# Patient Record
Sex: Male | Born: 1990 | Race: Black or African American | Hispanic: No | Marital: Single | Smoking: Current every day smoker
Health system: Southern US, Community
[De-identification: ages and names within clinical notes are randomized; demographics above are authoritative.]

## PROBLEM LIST (undated history)

## (undated) DIAGNOSIS — R51 Headache: Secondary | ICD-10-CM

---
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
## 2008-01-01 ENCOUNTER — Emergency Department (HOSPITAL_COMMUNITY): Admission: EM | Admit: 2008-01-01 | Discharge: 2008-01-01 | Payer: Self-pay | Admitting: Family Medicine

## 2008-07-29 ENCOUNTER — Emergency Department (HOSPITAL_COMMUNITY): Admission: EM | Admit: 2008-07-29 | Discharge: 2008-07-29 | Payer: Self-pay | Admitting: Emergency Medicine

## 2010-04-14 ENCOUNTER — Emergency Department (HOSPITAL_COMMUNITY): Admission: EM | Admit: 2010-04-14 | Discharge: 2010-04-14 | Payer: Self-pay | Admitting: Family Medicine

## 2011-02-08 LAB — GC/CHLAMYDIA PROBE AMP, GENITAL
Chlamydia, DNA Probe: NEGATIVE
GC Probe Amp, Genital: NEGATIVE

## 2011-08-13 LAB — WOUND CULTURE

## 2012-09-12 ENCOUNTER — Encounter (HOSPITAL_COMMUNITY): Payer: Self-pay | Admitting: *Deleted

## 2012-09-12 ENCOUNTER — Emergency Department (HOSPITAL_COMMUNITY)
Admission: EM | Admit: 2012-09-12 | Discharge: 2012-09-12 | Disposition: A | Discharge status: Home / Self Care | Payer: Self-pay | Attending: Emergency Medicine | Admitting: Emergency Medicine

## 2012-09-12 DIAGNOSIS — R112 Nausea with vomiting, unspecified: Secondary | ICD-10-CM | POA: Insufficient documentation

## 2012-09-12 DIAGNOSIS — R197 Diarrhea, unspecified: Secondary | ICD-10-CM | POA: Insufficient documentation

## 2012-09-12 DIAGNOSIS — F17200 Nicotine dependence, unspecified, uncomplicated: Secondary | ICD-10-CM | POA: Insufficient documentation

## 2012-09-12 DIAGNOSIS — R51 Headache: Secondary | ICD-10-CM | POA: Insufficient documentation

## 2012-09-12 HISTORY — DX: Headache: R51

## 2012-09-12 LAB — URINALYSIS, ROUTINE W REFLEX MICROSCOPIC
Glucose, UA: NEGATIVE mg/dL
Hgb urine dipstick: NEGATIVE
Ketones, ur: NEGATIVE mg/dL
Leukocytes, UA: NEGATIVE
Protein, ur: NEGATIVE mg/dL
Urobilinogen, UA: 1 mg/dL (ref 0.0–1.0)

## 2012-09-12 LAB — COMPREHENSIVE METABOLIC PANEL
Albumin: 3.8 g/dL (ref 3.5–5.2)
Alkaline Phosphatase: 78 U/L (ref 39–117)
BUN: 14 mg/dL (ref 6–23)
CO2: 28 mEq/L (ref 19–32)
Chloride: 102 mEq/L (ref 96–112)
Creatinine, Ser: 1.14 mg/dL (ref 0.50–1.35)
GFR calc non Af Amer: 89 mL/min — ABNORMAL LOW (ref >90)
Glucose, Bld: 96 mg/dL (ref 70–99)
Potassium: 3.8 mEq/L (ref 3.5–5.1)
Total Bilirubin: 0.1 mg/dL — ABNORMAL LOW (ref 0.3–1.2)

## 2012-09-12 LAB — CBC
HCT: 44.6 % (ref 39.0–52.0)
Hemoglobin: 15.1 g/dL (ref 13.0–17.0)
MCV: 90.8 fL (ref 78.0–100.0)
RBC: 4.91 MIL/uL (ref 4.22–5.81)
RDW: 13.9 % (ref 11.5–15.5)
WBC: 8.2 10*3/uL (ref 4.0–10.5)

## 2012-09-12 MED ORDER — FAMOTIDINE 20 MG PO TABS
40.0000 mg | ORAL_TABLET | Freq: Once | ORAL | Status: AC
  Administered 2012-09-12: 40 mg via ORAL
  Filled 2012-09-12: qty 2

## 2012-09-12 MED ORDER — ONDANSETRON 8 MG PO TBDP
8.0000 mg | ORAL_TABLET | Freq: Once | ORAL | Status: AC
  Administered 2012-09-12: 8 mg via ORAL
  Filled 2012-09-12: qty 1

## 2012-09-12 MED ORDER — ONDANSETRON HCL 4 MG PO TABS: 4.0000 mg | ORAL_TABLET | Freq: Three times a day (TID) | ORAL | Status: DC | PRN

## 2012-09-12 MED ORDER — GI COCKTAIL ~~LOC~~
30.0000 mL | Freq: Once | ORAL | Status: AC
  Administered 2012-09-12: 30 mL via ORAL
  Filled 2012-09-12: qty 30

## 2012-09-12 MED ORDER — ACETAMINOPHEN 500 MG PO TABS
1000.0000 mg | ORAL_TABLET | Freq: Once | ORAL | Status: AC
  Administered 2012-09-12: 1000 mg via ORAL
  Filled 2012-09-12: qty 2

## 2012-09-12 NOTE — ED Provider Notes (Signed)
History     CSN: [id number]  Arrival date & time 09/12/12  0903   First MD Initiated Contact with Patient 09/12/12 [phone]      Chief Complaint  Patient presents with  . Abdominal Pain     HPI Pt was seen at 0925.  Per pt, c/o gradual onset and persistence of constant upper abd "pain" for the past 2 days.  Has been associated with several intermittent episodes of N/V/D as well as an acute flair of his chronic headache. Describes the diarrhea as "loose stools." Denies CP/SOB, no cough, no back pain, no fevers, no rash, no black or blood in stools or emesis.  Describes the headache as per his usual chronic headache pain pattern.  Denies headache was sudden or maximal in onset or at any time.  Denies visual changes, no focal motor weakness, no tingling/numbness in extremities, no neck pain.     Past Medical History  Diagnosis Date  . Headache     History reviewed. No pertinent past surgical history.   History  Substance Use Topics  . Smoking status: Current Every Day Smoker    Types: Cigars  . Smokeless tobacco: Not on file  . Alcohol Use: No      Review of Systems ROS: Statement: All systems negative except as marked or noted in the HPI; Constitutional: Negative for fever and chills. ; ; Eyes: Negative for eye pain, redness and discharge. ; ; ENMT: Negative for ear pain, hoarseness, nasal congestion, sinus pressure and sore throat. ; ; Cardiovascular: Negative for chest pain, palpitations, diaphoresis, dyspnea and peripheral edema. ; ; Respiratory: Negative for cough, wheezing and stridor. ; ; Gastrointestinal: +abd pain, N/V, "loose stools." Negative for diarrhea, blood in stool, hematemesis, jaundice and rectal bleeding. . ; ; Genitourinary: Negative for dysuria, flank pain and hematuria. ; ; Musculoskeletal: Negative for back pain and neck pain. Negative for swelling and trauma.; ; Skin: Negative for pruritus, rash, abrasions, blisters, bruising and skin lesion.; ; Neuro: +chronic  headache. Negative for lightheadedness and neck stiffness. Negative for weakness, altered level of consciousness , altered mental status, extremity weakness, paresthesias, involuntary movement, seizure and syncope.       Allergies  Review of patient's allergies indicates no known allergies.  Home Medications   Current Outpatient Rx  Name Route Sig Dispense Refill  . ACETAMINOPHEN 500 MG PO TABS Oral Take 1,000 mg by mouth every 6 (six) hours as needed. Pain    . IBUPROFEN 200 MG PO TABS Oral Take 200 mg by mouth every 6 (six) hours as needed. Pain      BP 118/65  Pulse 57  Temp 98.6 F (37 C) (Oral)  Resp 20  Wt 187 lb (84.823 kg)  SpO2 98%  Physical Exam 0930: Physical examination:  Nursing notes reviewed; Vital signs and O2 SAT reviewed;  Constitutional: Well developed, Well nourished, Well hydrated, In no acute distress; Head:  Normocephalic, atraumatic; Eyes: EOMI, PERRL, No scleral icterus; ENMT: Mouth and pharynx normal, Mucous membranes moist; Neck: Supple, Full range of motion, No lymphadenopathy; Cardiovascular: Regular rate and rhythm, No murmur, rub, or gallop; Respiratory: Breath sounds clear & equal bilaterally, No rales, rhonchi, wheezes.  Speaking full sentences with ease, Normal respiratory effort/excursion; Chest: Nontender, Movement normal; Abdomen: Soft, Nontender, Nondistended, Normal bowel sounds; Genitourinary: No CVA tenderness; Extremities: Pulses normal, No tenderness, No edema, No calf edema or asymmetry.; Neuro: AA&Ox3, Major CN grossly intact.  Speech clear. Climbs on and off stretcher by himself easily and  without distress. Gait steady. No gross focal motor or sensory deficits in extremities.; Skin: Color normal, Warm, Dry.   ED Course  Procedures    MDM  MDM Reviewed: nursing note and vitals Interpretation: labs   Results for orders placed during the hospital encounter of 09/12/12  CBC      Component Value Range   WBC 8.2  4.0 - 10.5 K/uL   RBC  4.91  4.22 - 5.81 MIL/uL   Hemoglobin 15.1  13.0 - 17.0 g/dL   HCT 96.0  45.4 - 09.8 %   MCV 90.8  78.0 - 100.0 fL   MCH 30.8  26.0 - 34.0 pg   MCHC 33.9  30.0 - 36.0 g/dL   RDW 11.9  14.7 - 82.9 %   Platelets 280  150 - 400 K/uL  COMPREHENSIVE METABOLIC PANEL      Component Value Range   Sodium 139  135 - 145 mEq/L   Potassium 3.8  3.5 - 5.1 mEq/L   Chloride 102  96 - 112 mEq/L   CO2 28  19 - 32 mEq/L   Glucose, Bld 96  70 - 99 mg/dL   BUN 14  6 - 23 mg/dL   Creatinine, Ser 5.62  0.50 - 1.35 mg/dL   Calcium 9.5  8.4 - 13.0 mg/dL   Total Protein 7.2  6.0 - 8.3 g/dL   Albumin 3.8  3.5 - 5.2 g/dL   AST 10  0 - 37 U/L   ALT 15  0 - 53 U/L   Alkaline Phosphatase 78  39 - 117 U/L   Total Bilirubin 0.1 (*) 0.3 - 1.2 mg/dL   GFR calc non Af Amer 89 (*) >90 mL/min   GFR calc Af Amer >90  >90 mL/min  URINALYSIS, ROUTINE W REFLEX MICROSCOPIC      Component Value Range   Color, Urine YELLOW  YELLOW   APPearance CLOUDY (*) CLEAR   Specific Gravity, Urine 1.020  1.005 - 1.030   pH 7.5  5.0 - 8.0   Glucose, UA NEGATIVE  NEGATIVE mg/dL   Hgb urine dipstick NEGATIVE  NEGATIVE   Bilirubin Urine NEGATIVE  NEGATIVE   Ketones, ur NEGATIVE  NEGATIVE mg/dL   Protein, ur NEGATIVE  NEGATIVE mg/dL   Urobilinogen, UA 1.0  0.0 - 1.0 mg/dL   Nitrite NEGATIVE  NEGATIVE   Leukocytes, UA NEGATIVE  NEGATIVE  LIPASE, BLOOD      Component Value Range   Lipase 22  11 - 59 U/L     1055:  Pt has tol PO well without N/V.  No stooling while in the ED.  Wants to go home now.  Dx and testing d/w pt.  Questions answered.  Verb understanding, agreeable to d/c home with outpt f/u.            Laray Anger, DO 09/12/12 1956

## 2012-09-12 NOTE — ED Notes (Signed)
Pt alert and oriented x4. Respirations even and unlabored, bilateral symmetrical rise and fall of chest. Skin warm and dry. In no acute distress. Denies needs.   

## 2012-09-12 NOTE — ED Notes (Signed)
md alerted pt wants to go. Pt tolerated fluids and did not vomit

## 2012-09-12 NOTE — ED Notes (Signed)
Pt reports abd pain with n/v since Sunday.  Reports vomiting x 4 this am.  Denies any diarrhea at this time.  Pt also reports h/a.

## 2012-09-13 LAB — URINE CULTURE: Colony Count: NO GROWTH

## 2013-02-26 ENCOUNTER — Emergency Department (HOSPITAL_COMMUNITY)
Admission: EM | Admit: 2013-02-26 | Discharge: 2013-02-26 | Disposition: A | Discharge status: Home / Self Care | Payer: Self-pay | Source: Home / Self Care

## 2013-02-26 ENCOUNTER — Encounter (HOSPITAL_COMMUNITY): Payer: Self-pay | Admitting: *Deleted

## 2013-02-26 DIAGNOSIS — J209 Acute bronchitis, unspecified: Secondary | ICD-10-CM

## 2013-02-26 MED ORDER — GUAIFENESIN-CODEINE 100-10 MG/5ML PO SYRP: 5.0000 mL | ORAL_SOLUTION | Freq: Three times a day (TID) | ORAL | Status: AC | PRN

## 2013-02-26 NOTE — ED Provider Notes (Signed)
History     CSN: [id number]  Arrival date & time 02/26/13  1134   First MD Initiated Contact with Patient 02/26/13 1200      Chief Complaint  Patient presents with  . Cough    (Consider location/radiation/quality/duration/timing/severity/associated sxs/prior treatment) Patient is a 22 y.o. male presenting with cough.  Cough Associated symptoms: no shortness of breath and no wheezing    22 y/o presents with cough and a frontal headache. He vomited twice today and then ate without any further vomiting.   Past Medical History  Diagnosis Date  . Headache     History reviewed. No pertinent past surgical history.  History reviewed. No pertinent family history.  History  Substance Use Topics  . Smoking status: Current Every Day Smoker    Types: Cigars  . Smokeless tobacco: Not on file  . Alcohol Use: No      Review of Systems  Constitutional: Negative.   HENT: Negative.   Eyes: Negative.   Respiratory: Positive for cough. Negative for choking, chest tightness, shortness of breath, wheezing and stridor.   Cardiovascular: Negative.   Gastrointestinal: Positive for vomiting.  Endocrine: Negative.   Genitourinary: Negative.   Musculoskeletal: Negative.   Skin: Negative.   Neurological: Negative.   Hematological: Negative.   Psychiatric/Behavioral: Negative.     Allergies  Review of patient's allergies indicates no known allergies.  Home Medications   Current Outpatient Rx  Name  Route  Sig  Dispense  Refill  . guaiFENesin-codeine (ROBITUSSIN AC) 100-10 MG/5ML syrup   Oral   Take 5 mLs by mouth 3 (three) times daily as needed for cough.   120 mL   0     BP 116/72  Pulse 48  Temp(Src) 98.7 F (37.1 C) (Oral)  Resp 16  SpO2 100%  Physical Exam  Constitutional: He is oriented to person, place, and time. He appears well-developed and well-nourished.  HENT:  Head: Normocephalic and atraumatic.  Eyes: Conjunctivae are normal. Pupils are equal, round, and  reactive to light.  Neck: Normal range of motion. Neck supple.  Cardiovascular: Normal rate and regular rhythm.   Pulmonary/Chest: Effort normal and breath sounds normal.  Abdominal: Soft. Bowel sounds are normal.  Musculoskeletal: Normal range of motion.  Neurological: He is alert and oriented to person, place, and time.  Skin: Skin is warm and dry.  Psychiatric: He has a normal mood and affect. His behavior is normal.    ED Course  Procedures (including critical care time)  Labs Reviewed - No data to display No results found.   1. Acute bronchitis       MDM  Likely viral bronchitis.  Prescription given for Robitussin [facility] but advised to only use if over the counter Robitussin DM is ineffective in controlling cough. Cont Ibuprofen 400 mg every 4-5 hrs for headace (which will likely resolve once he stops coughing)        Calvert Cantor, MD 02/26/13 1452

## 2013-02-26 NOTE — ED Notes (Signed)
Pt   Reports     Symptoms   Of  Cough       Headache   /  Dizzy            Which  Started  Last  Pm    -  He  Reports  Vomited  Today    -      He  Is  Sitting  Upright on  Exam table  texting  When   This  Writer  Entered  Room

## 2013-07-03 ENCOUNTER — Encounter (HOSPITAL_COMMUNITY): Payer: Self-pay | Admitting: Emergency Medicine

## 2013-07-03 ENCOUNTER — Emergency Department (INDEPENDENT_AMBULATORY_CARE_PROVIDER_SITE_OTHER)
Admission: EM | Admit: 2013-07-03 | Discharge: 2013-07-03 | Disposition: A | Discharge status: Home / Self Care | Payer: Self-pay | Source: Home / Self Care | Attending: Emergency Medicine | Admitting: Emergency Medicine

## 2013-07-03 DIAGNOSIS — H1033 Unspecified acute conjunctivitis, bilateral: Secondary | ICD-10-CM

## 2013-07-03 DIAGNOSIS — H1030 Unspecified acute conjunctivitis, unspecified eye: Secondary | ICD-10-CM

## 2013-07-03 MED ORDER — POLYMYXIN B-TRIMETHOPRIM 10000-0.1 UNIT/ML-% OP SOLN: 1.0000 [drp] | OPHTHALMIC | Status: AC

## 2013-07-03 NOTE — ED Provider Notes (Signed)
CSN: [id number]     Arrival date & time 07/03/13  1333 History     None    Chief Complaint  Patient presents with  . Conjunctivitis    bilateral eye drainage, redness, itching and pain.    (Consider location/radiation/quality/duration/timing/severity/associated sxs/prior Treatment) HPI Comments: 22 year old male presents complaining of redness, itching, and burning in both eyes for about 2 days. He  recently had a friend in his home who called him and told him that she has conjunctivitis. Soon after that, she started to have this  problem. This started in his right eye and then spread to the left. he is having thick discharge in his eyes and crusted shut in the mornings. He denies fever, chills, shortness of breath, blurry vision. He states his vision is blurry at baseline and he needs glasses but it is not more blurry than usual today   Patient is a 22 y.o. male presenting with conjunctivitis.  Conjunctivitis Pertinent negatives include no chest pain, no abdominal pain and no shortness of breath.    Past Medical History  Diagnosis Date  . Headache(784.0)    History reviewed. No pertinent past surgical history. History reviewed. No pertinent family history. History  Substance Use Topics  . Smoking status: Current Every Day Smoker    Types: Cigars  . Smokeless tobacco: Not on file  . Alcohol Use: No    Review of Systems  Constitutional: Negative for fever, chills and fatigue.  HENT: Negative for sore throat, neck pain and neck stiffness.   Eyes: Positive for discharge, redness and itching. Negative for photophobia, pain and visual disturbance.  Respiratory: Negative for cough and shortness of breath.   Cardiovascular: Negative for chest pain, palpitations and leg swelling.  Gastrointestinal: Negative for nausea, vomiting, abdominal pain, diarrhea and constipation.  Genitourinary: Negative for dysuria, urgency, frequency and hematuria.  Musculoskeletal: Negative for myalgias  and arthralgias.  Skin: Negative for rash.  Neurological: Negative for dizziness, weakness and light-headedness.    Allergies  Review of patient's allergies indicates no known allergies.  Home Medications   Current Outpatient Rx  Name  Route  Sig  Dispense  Refill  . guaiFENesin-codeine (ROBITUSSIN AC) 100-10 MG/5ML syrup   Oral   Take 5 mLs by mouth 3 (three) times daily as needed for cough.   120 mL   0   . trimethoprim-polymyxin b (POLYTRIM) ophthalmic solution   Both Eyes   Place 1 drop into both eyes every 4 (four) hours.   10 mL   1    BP 142/73  Pulse 60  Temp(Src) 98.1 F (36.7 C) (Oral)  Resp 16  SpO2 100% Physical Exam  Nursing note and vitals reviewed. Constitutional: He is oriented to person, place, and time. He appears well-developed and well-nourished. No distress.  HENT:  Head: Normocephalic and atraumatic.  Eyes: Right eye exhibits discharge. Left eye exhibits discharge. Right conjunctiva is injected. Left conjunctiva is injected.  Neck: Normal range of motion.  Musculoskeletal: Normal range of motion.  Lymphadenopathy:    He has no cervical adenopathy.  Neurological: He is alert and oriented to person, place, and time. Coordination normal.  Skin: Skin is dry. No rash noted. He is not diaphoretic.  Psychiatric: He has a normal mood and affect. Judgment normal.    ED Course   Procedures (including critical care time)  Labs Reviewed - No data to display No results found. 1. Conjunctivitis, acute, bilateral     MDM  Bacterial conjunctivitis. Treat with with  Polytrim ophthalmic solution. Followup if not improving   Meds ordered this encounter  Medications  . trimethoprim-polymyxin b (POLYTRIM) ophthalmic solution    Sig: Place 1 drop into both eyes every 4 (four) hours.    Dispense:  10 mL    Refill:  1     Graylon Good, PA-C 07/03/13 1447

## 2013-07-03 NOTE — ED Provider Notes (Signed)
Medical screening examination/treatment/procedure(s) were performed by non-physician practitioner and as supervising physician I was immediately available for consultation/collaboration.  Raynald Blend, MD 07/03/13 1455

## 2013-07-03 NOTE — ED Notes (Signed)
Reports redness, itching, and drainage from both eyes x 2 days. Pt has used eye drops but it makes symptoms worse. Denies fever and any other symptoms.

## 2015-09-11 ENCOUNTER — Encounter (HOSPITAL_COMMUNITY): Payer: Self-pay | Admitting: Emergency Medicine

## 2015-09-11 ENCOUNTER — Emergency Department (INDEPENDENT_AMBULATORY_CARE_PROVIDER_SITE_OTHER)
Admission: EM | Admit: 2015-09-11 | Discharge: 2015-09-11 | Disposition: A | Discharge status: Home / Self Care | Payer: Self-pay | Source: Home / Self Care | Attending: Family Medicine | Admitting: Family Medicine

## 2015-09-11 DIAGNOSIS — S39012A Strain of muscle, fascia and tendon of lower back, initial encounter: Secondary | ICD-10-CM

## 2015-09-11 MED ORDER — METHOCARBAMOL 500 MG PO TABS: 500.0000 mg | ORAL_TABLET | Freq: Four times a day (QID) | ORAL | Status: DC | PRN

## 2015-09-11 MED ORDER — DICLOFENAC SODIUM 75 MG PO TBEC: 75.0000 mg | DELAYED_RELEASE_TABLET | Freq: Two times a day (BID) | ORAL | Status: AC
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
## 2015-10-15 ENCOUNTER — Encounter (HOSPITAL_COMMUNITY): Payer: Self-pay | Admitting: Emergency Medicine

## 2015-10-15 ENCOUNTER — Emergency Department (INDEPENDENT_AMBULATORY_CARE_PROVIDER_SITE_OTHER)
Admission: EM | Admit: 2015-10-15 | Discharge: 2015-10-15 | Disposition: A | Discharge status: Home / Self Care | Payer: Self-pay | Source: Home / Self Care

## 2015-10-15 DIAGNOSIS — J40 Bronchitis, not specified as acute or chronic: Secondary | ICD-10-CM

## 2015-10-15 LAB — POCT RAPID STREP A: Streptococcus, Group A Screen (Direct): NEGATIVE

## 2015-10-15 MED ORDER — HYDROCOD POLST-CPM POLST ER 10-8 MG/5ML PO SUER: 5.0000 mL | Freq: Four times a day (QID) | ORAL | Status: AC

## 2015-10-15 MED ORDER — IPRATROPIUM-ALBUTEROL 0.5-2.5 (3) MG/3ML IN SOLN
RESPIRATORY_TRACT | Status: AC
  Filled 2015-10-15: qty 3

## 2015-10-15 MED ORDER — IPRATROPIUM-ALBUTEROL 0.5-2.5 (3) MG/3ML IN SOLN: 3.0000 mL | Freq: Once | RESPIRATORY_TRACT | Status: DC

## 2015-10-15 MED ORDER — ALBUTEROL SULFATE (2.5 MG/3ML) 0.083% IN NEBU: 2.5000 mg | INHALATION_SOLUTION | Freq: Once | RESPIRATORY_TRACT | Status: DC

## 2015-10-15 MED ORDER — ALBUTEROL SULFATE (2.5 MG/3ML) 0.083% IN NEBU
INHALATION_SOLUTION | RESPIRATORY_TRACT | Status: AC
  Filled 2015-10-15: qty 3

## 2015-10-15 MED ORDER — AZITHROMYCIN 250 MG PO TABS: 250.0000 mg | ORAL_TABLET | Freq: Every day | ORAL | Status: AC

## 2015-10-15 MED ORDER — ALBUTEROL SULFATE HFA 108 (90 BASE) MCG/ACT IN AERS: 2.0000 | INHALATION_SPRAY | RESPIRATORY_TRACT | Status: AC | PRN

## 2015-10-15 NOTE — ED Provider Notes (Addendum)
CSN: [id number]     Arrival date & time 10/15/15  1356 History   None    Chief Complaint  Patient presents with  . Cough  . Sore Throat   (Consider location/radiation/quality/duration/timing/severity/associated sxs/prior Treatment) HPI History obtained from patient:   LOCATION: throat, chest SEVERITY:6 DURATION: several days CONTEXT: sudden onset QUALITY: dry cough, posterior chest pain MODIFYING FACTORS: robitussin without relief ASSOCIATED SYMPTOMS: wheezing TIMING:constant OCCUPATION:waiter   Past Medical History  Diagnosis Date  . Headache(784.0)    History reviewed. No pertinent past surgical history. History reviewed. No pertinent family history. Social History  Substance Use Topics  . Smoking status: Current Every Day Smoker    Types: Cigars  . Smokeless tobacco: None  . Alcohol Use: No    Review of Systems ROS +'ve cough, sore throat  Denies: HEADACHE, NAUSEA, ABDOMINAL PAIN, CHEST PAIN, CONGESTION, DYSURIA, SHORTNESS OF BREATH  Allergies  Review of patient's allergies indicates no known allergies.  Home Medications   Prior to Admission medications   Medication Sig Start Date End Date Taking? Authorizing Provider  albuterol (PROVENTIL HFA;VENTOLIN HFA) 108 (90 BASE) MCG/ACT inhaler Inhale 2 puffs into the lungs every 4 (four) hours as needed for wheezing or shortness of breath. 10/15/15   Tharon AquasFrank C Patrick, PA  azithromycin (ZITHROMAX) 250 MG tablet Take 1 tablet (250 mg total) by mouth daily. Take first 2 tablets together, then 1 every day until finished. 10/15/15   Tharon AquasFrank C Patrick, PA  chlorpheniramine-HYDROcodone [facility](TUSSIONEX PENNKINETIC ER) 10-8 MG/5ML SUER Take 5 mLs by mouth 4 (four) times daily. 10/15/15   Tharon AquasFrank C Patrick, PA  diclofenac (VOLTAREN) 75 MG EC tablet Take 1 tablet (75 mg total) by mouth 2 (two) times daily. 09/11/15   Ozella Rocksavid J Merrell, MD  guaiFENesin-codeine [facility](ROBITUSSIN AC) 100-10 MG/5ML syrup Take 5 mLs by mouth 3 (three) times daily as  needed for cough. 02/26/13   Calvert CantorSaima Rizwan, MD  methocarbamol (ROBAXIN) 500 MG tablet Take 1-2 tablets (500-1,000 mg total) by mouth every 6 (six) hours as needed for muscle spasms. 09/11/15   Ozella Rocksavid J Merrell, MD   Meds Ordered and Administered this Visit   Medications  albuterol (PROVENTIL) (2.5 MG/3ML) 0.083% nebulizer solution 2.5 mg (not administered)  ipratropium-albuterol (DUONEB) 0.5-2.5 (3) MG/3ML nebulizer solution 3 mL (not administered)    BP 131/78 mmHg  Pulse 95  Temp(Src) 98.7 F (37.1 C) (Oral)  Resp 16  SpO2 95% No data found.   Physical Exam NURSES NOTES AND VITAL SIGNS REVIEWED. CONSTITUTIONAL: Well developed, well nourished, no acute distress HEENT: normocephalic, atraumatic EYES: Conjunctiva normal NECK:normal ROM, supple PULMONARY:No respiratory distress, normal effort, Lungs: Diffuse inspiratory and expiratory wheezes throughout the lung fields without consolidation no use of the sensory muscles. CARDIOVASCULAR: RRR, no murmur ABDOMEN: soft, ND, NT, +'ve BS MUSCULOSKELETAL: Normal ROM of all extremities SKIN: warm and dry without rash PSYCHIATRIC: Mood and affect normal   ED Course  Procedures (including critical care time)  Labs Review Labs Reviewed - No data to display  Imaging Review No results found.   Visual Acuity Review  Right Eye Distance:   Left Eye Distance:   Bilateral Distance:    Right Eye Near:   Left Eye Near:    Bilateral Near:        Duo neb administered, with improvement of symptoms MDM   1. Bronchitis    Prescriptions provided for azithromycin, Albuterol inhaler, Tussionex for cough. Patient is advised and encouraged to stop smoking. Continue symptomatic care at home. Return  to work note for Friday is given. Instructions of care provided discharged home in stable condition.    Tharon Aquas, PA 10/15/15 1545  Tharon Aquas, PA 10/15/15 1747  Tharon Aquas, [place] 12/09/15 2027

## 2015-10-15 NOTE — ED Notes (Signed)
[facility] a [facility] and a cough that has been ongoing for 3 days.

## 2015-10-15 NOTE — Discharge Instructions (Signed)
Metered Dose Inhaler (No Spacer Used) Inhaled medicines are the basis of treatment for asthma and other breathing problems. Inhaled medicine can only be effective if used properly. Good technique assures that the medicine reaches the lungs. Metered dose inhalers (MDIs) are used to deliver a variety of inhaled medicines. These include quick relief or rescue medicines (such as bronchodilators) and controller medicines (such as corticosteroids). The medicine is delivered by pushing down on a metal canister to release a set amount of spray. If you are using different kinds of inhalers, use your quick relief medicine to open the airways 10-15 minutes before using a steroid, if instructed to do so by your health care provider. If you are unsure which inhalers to use and the order of using them, ask your health care provider, nurse, or respiratory therapist. HOW TO USE THE INHALER  Remove the cap from the inhaler.  If you are using the inhaler for the first time, you will need to prime it. Shake the inhaler for 5 seconds and release four puffs into the air, away from your face. Ask your health care provider or pharmacist if you have questions about priming your inhaler.  Shake the inhaler for 5 seconds before each breath in (inhalation).  Position the inhaler so that the top of the canister faces up.  Put your index finger on the top of the medicine canister. Your thumb supports the bottom of the inhaler.  Open your mouth.  Either place the inhaler between your teeth and place your lips tightly around the mouthpiece, or hold the inhaler 1-2 inches away from your open mouth. If you are unsure of which technique to use, ask your health care provider.  Breathe out (exhale) normally and as completely as possible.  Press the canister down with the index finger to release the medicine.  At the same time as the canister is pressed, inhale deeply and slowly until your lungs are completely filled. This  should take 4-6 seconds. Keep your tongue down.  Hold the medicine in your lungs for 5-10 seconds (10 seconds is best). This helps the medicine get into the small airways of your lungs.  Breathe out slowly, through pursed lips. Whistling is an example of pursed lips.  Wait at least 1 minute between puffs. Continue with the above steps until you have taken the number of puffs your health care provider has ordered. Do not use the inhaler more than your health care provider directs you to.  Replace the cap on the inhaler.  Follow the directions from your health care provider or the inhaler insert for cleaning the inhaler. If you are using a steroid inhaler, after your last puff, rinse your mouth with water, gargle, and spit out the water. Do not swallow the water. AVOID:  Inhaling before or after starting the spray of medicine. It takes practice to coordinate your breathing with triggering the spray.  Inhaling through the nose (rather than the mouth) when triggering the spray. HOW TO DETERMINE IF YOUR INHALER IS FULL OR NEARLY EMPTY You cannot know when an inhaler is empty by shaking it. Some inhalers are now being made with dose counters. Ask your health care provider for a prescription that has a dose counter if you feel you need that extra help. If your inhaler does not have a counter, ask your health care provider to help you determine the date you need to refill your inhaler. Write the refill date on a calendar or your inhaler canister. Refill  your inhaler 7-10 days before it runs out. Be sure to keep an adequate supply of medicine. This includes making sure it has not expired, and making sure you have a spare inhaler. SEEK MEDICAL CARE IF:  Symptoms are only partially relieved with your inhaler.  You are having trouble using your inhaler.  You experience an increase in phlegm. SEEK IMMEDIATE MEDICAL CARE IF:  You feel little or no relief with your inhalers. You are still wheezing and  feeling shortness of breath, tightness in your chest, or both.  You have dizziness, headaches, or a fast heart rate.  You have chills, fever, or night sweats.  There is a noticeable increase in phlegm production, or there is blood in the phlegm. MAKE SURE YOU:  Understand these instructions.  Will watch your condition.  Will get help right away if you are not doing well or get worse.   This information is not intended to replace advice given to you by your health care provider. Make sure you discuss any questions you have with your health care provider.   Document Released: 09/05/2007 Document Revised: 11/29/2014 Document Reviewed: 04/26/2013 Elsevier Interactive Patient Education 2016 Elsevier Inc[employer] Acute Bronchitis Bronchitis is when the airways that extend from the windpipe into the lungs get red, puffy, and painful (inflamed). Bronchitis often causes thick spit (mucus) to develop. This leads to a cough. A cough is the most common symptom of bronchitis. In acute bronchitis, the condition usually begins suddenly and goes away over time (usually in 2 weeks). Smoking, allergies, and asthma can make bronchitis worse. Repeated episodes of bronchitis may cause more lung problems. HOME CARE  Rest.  Drink enough fluids to keep your pee (urine) clear or pale yellow (unless you need to limit fluids as told by your doctor).  Only take over-the-counter or prescription medicines as told by your doctor.  Avoid smoking and secondhand smoke. These can make bronchitis worse. If you are a smoker, think about using nicotine gum or skin patches. Quitting smoking will help your lungs heal faster.  Reduce the chance of getting bronchitis again by:  Washing your hands often.  Avoiding people with cold symptoms.  Trying not to touch your hands to your mouth, nose, or eyes.  Follow up with your doctor as told. GET HELP IF: Your symptoms do not improve after 1 week of treatment. Symptoms  include:  Cough.  Fever.  Coughing up thick spit.  Body aches.  Chest congestion.  Chills.  Shortness of breath.  Sore throat. GET HELP RIGHT AWAY IF:   You have an increased fever.  You have chills.  You have severe shortness of breath.  You have bloody thick spit (sputum).  You throw up (vomit) often.  You lose too much body fluid (dehydration).  You have a severe headache.  You faint. MAKE SURE YOU:   Understand these instructions.  Will watch your condition.  Will get help right away if you are not doing well or get worse.   This information is not intended to replace advice given to you by your health care provider. Make sure you discuss any questions you have with your health care provider.   Document Released: 04/26/2008 Document Revised: 07/11/2013 Document Reviewed: 05/01/2013 Elsevier Interactive Patient Education [employer] Elsevier Inc[employer]

## 2015-10-17 LAB — CULTURE, GROUP A STREP

## 2021-01-31 ENCOUNTER — Emergency Department (HOSPITAL_COMMUNITY): Payer: No Typology Code available for payment source

## 2021-01-31 ENCOUNTER — Emergency Department (HOSPITAL_COMMUNITY)
Admission: EM | Admit: 2021-01-31 | Discharge: 2021-01-31 | Disposition: A | Discharge status: Home / Self Care | Payer: No Typology Code available for payment source | Attending: Emergency Medicine | Admitting: Emergency Medicine

## 2021-01-31 ENCOUNTER — Encounter (HOSPITAL_COMMUNITY): Payer: Self-pay | Admitting: Emergency Medicine

## 2021-01-31 DIAGNOSIS — M542 Cervicalgia: Secondary | ICD-10-CM | POA: Insufficient documentation

## 2021-01-31 DIAGNOSIS — S80212A Abrasion, left knee, initial encounter: Secondary | ICD-10-CM | POA: Insufficient documentation

## 2021-01-31 DIAGNOSIS — R936 Abnormal findings on diagnostic imaging of limbs: Secondary | ICD-10-CM

## 2021-01-31 DIAGNOSIS — S8992XA Unspecified injury of left lower leg, initial encounter: Secondary | ICD-10-CM | POA: Diagnosis present

## 2021-01-31 DIAGNOSIS — M549 Dorsalgia, unspecified: Secondary | ICD-10-CM | POA: Diagnosis not present

## 2021-01-31 DIAGNOSIS — F17290 Nicotine dependence, other tobacco product, uncomplicated: Secondary | ICD-10-CM | POA: Insufficient documentation

## 2021-01-31 MED ORDER — METHOCARBAMOL 500 MG PO TABS: 500.0000 mg | ORAL_TABLET | Freq: Two times a day (BID) | ORAL | 0 refills | Status: AC
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
## 2021-01-31 NOTE — Discharge Instructions (Signed)
Your back x-rays were overall reassuring, the knee x-ray showed a abnormality of your femur bone which is the bone above the knee.  Findings are likely benign, but I do recommend that you follow-up with a primary care doctor to have repeat x-rays and have this followed up on.  Continue to take ibuprofen, 800 mg up to 3 times daily x1 week.  You may take the muscle relaxer at night, do not drink or drive on the as it can make you sleepy.  You may do some gentle stretching, heating pads as needed.  Please follow-up with your primary care doctor if your symptoms not improve.  Return to the ER for any new or worsening symptoms peer

## 2022-03-05 ENCOUNTER — Ambulatory Visit (HOSPITAL_COMMUNITY)
Admission: EM | Admit: 2022-03-05 | Discharge: 2022-03-05 | Discharge status: Against Medical Advice | Payer: Self-pay | Attending: Internal Medicine | Admitting: Internal Medicine

## 2022-03-05 NOTE — ED Notes (Signed)
Pt called from lobby x2 with no answer ?

## 2022-03-05 NOTE — ED Notes (Signed)
Pt called from lobby x 1 with no answer. 

## 2022-03-05 NOTE — ED Notes (Signed)
No answer from lobby  
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
## 2023-03-22 ENCOUNTER — Telehealth: Payer: Self-pay

## 2023-03-22 NOTE — Telephone Encounter (Signed)
LVM for patient to schedule apt. AS, CMA 

## 2023-08-23 ENCOUNTER — Telehealth: Payer: Self-pay

## 2023-08-23 NOTE — Telephone Encounter (Signed)
  Medicaid Managed Care   Unsuccessful Outreach Note  08/23/2023 Name: DEONTEZ KLINKE MRN: [id number] DOB: [birth year]  Referred by: Patient, No Pcp Per Reason for referral : No chief complaint on file.   An unsuccessful telephone outreach was attempted today. The patient was referred to the case management team for assistance with care management and care coordination.   Follow Up Plan: If patient returns call to provider office, please advise to call Embedded Care Management Care Guide Nicholes Rough* at [phone]*  Nicholes Rough, CMA Care Guide [employer]
